# Patient Record
Sex: Male | Born: 2000
Health system: Southern US, Community
[De-identification: ages and names within clinical notes are randomized; demographics above are authoritative.]

## PROBLEM LIST (undated history)

## (undated) DIAGNOSIS — F909 Attention-deficit hyperactivity disorder, unspecified type: Secondary | ICD-10-CM

## (undated) HISTORY — DX: Attention-deficit hyperactivity disorder, unspecified type: F90.9

---
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
## 2019-07-25 ENCOUNTER — Emergency Department (HOSPITAL_COMMUNITY)
Admission: EM | Admit: 2019-07-25 | Discharge: 2019-07-25 | Disposition: A | Discharge status: Home / Self Care | Payer: Medicaid Other | Attending: Emergency Medicine | Admitting: Emergency Medicine

## 2019-07-25 ENCOUNTER — Encounter (HOSPITAL_COMMUNITY): Payer: Self-pay | Admitting: Emergency Medicine

## 2019-07-25 DIAGNOSIS — S50312A Abrasion of left elbow, initial encounter: Secondary | ICD-10-CM | POA: Diagnosis not present

## 2019-07-25 DIAGNOSIS — Y998 Other external cause status: Secondary | ICD-10-CM | POA: Insufficient documentation

## 2019-07-25 DIAGNOSIS — S8011XA Contusion of right lower leg, initial encounter: Secondary | ICD-10-CM | POA: Insufficient documentation

## 2019-07-25 DIAGNOSIS — S90811A Abrasion, right foot, initial encounter: Secondary | ICD-10-CM | POA: Diagnosis not present

## 2019-07-25 DIAGNOSIS — M79661 Pain in right lower leg: Secondary | ICD-10-CM

## 2019-07-25 DIAGNOSIS — F909 Attention-deficit hyperactivity disorder, unspecified type: Secondary | ICD-10-CM | POA: Insufficient documentation

## 2019-07-25 DIAGNOSIS — Y92410 Unspecified street and highway as the place of occurrence of the external cause: Secondary | ICD-10-CM | POA: Insufficient documentation

## 2019-07-25 DIAGNOSIS — T148XXA Other injury of unspecified body region, initial encounter: Secondary | ICD-10-CM

## 2019-07-25 DIAGNOSIS — Y9389 Activity, other specified: Secondary | ICD-10-CM | POA: Diagnosis not present

## 2019-07-25 DIAGNOSIS — R519 Headache, unspecified: Secondary | ICD-10-CM | POA: Insufficient documentation

## 2019-07-25 DIAGNOSIS — S8991XA Unspecified injury of right lower leg, initial encounter: Secondary | ICD-10-CM | POA: Diagnosis present

## 2019-07-25 MED ORDER — IBUPROFEN 800 MG PO TABS
800.0000 mg | ORAL_TABLET | Freq: Once | ORAL | Status: AC
  Administered 2019-07-25: 800 mg via ORAL
  Filled 2019-07-25: qty 1

## 2019-07-25 MED ORDER — NAPROXEN 500 MG PO TABS: 500.0000 mg | ORAL_TABLET | Freq: Two times a day (BID) | ORAL | 0 refills | Status: AC
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
## 2019-07-25 NOTE — Discharge Instructions (Signed)
Take naproxen 2 times a day with meals.  Do not take other anti-inflammatories at the same time (Advil, Motrin, ibuprofen, Aleve). You may supplement with Tylenol if you need further pain control. Use ice packs or heating pads if this helps control your pain. You will likely have continued muscle stiffness and soreness over the next couple days.  Your calf may continue to hurt for up to a couple of weeks. Return to the emergency room if you develop vision changes, vomiting, slurred speech, numbness, loss of bowel or bladder control, or any new or worsening symptoms.

## 2019-07-25 NOTE — ED Notes (Signed)
Patient verbalizes understanding of discharge instructions. Opportunity for questioning and answers were provided. Armband removed by staff, pt discharged from ED ambulatory to home.  

## 2019-08-05 DIAGNOSIS — Z419 Encounter for procedure for purposes other than remedying health state, unspecified: Secondary | ICD-10-CM | POA: Diagnosis not present

## 2019-09-05 DIAGNOSIS — Z419 Encounter for procedure for purposes other than remedying health state, unspecified: Secondary | ICD-10-CM | POA: Diagnosis not present

## 2019-10-06 DIAGNOSIS — Z419 Encounter for procedure for purposes other than remedying health state, unspecified: Secondary | ICD-10-CM | POA: Diagnosis not present

## 2019-11-05 DIAGNOSIS — Z419 Encounter for procedure for purposes other than remedying health state, unspecified: Secondary | ICD-10-CM | POA: Diagnosis not present

## 2019-11-15 ENCOUNTER — Other Ambulatory Visit: Payer: Self-pay

## 2019-12-06 DIAGNOSIS — Z419 Encounter for procedure for purposes other than remedying health state, unspecified: Secondary | ICD-10-CM | POA: Diagnosis not present

## 2020-01-05 DIAGNOSIS — Z419 Encounter for procedure for purposes other than remedying health state, unspecified: Secondary | ICD-10-CM | POA: Diagnosis not present

## 2020-02-05 DIAGNOSIS — Z419 Encounter for procedure for purposes other than remedying health state, unspecified: Secondary | ICD-10-CM | POA: Diagnosis not present

## 2020-03-07 DIAGNOSIS — Z419 Encounter for procedure for purposes other than remedying health state, unspecified: Secondary | ICD-10-CM | POA: Diagnosis not present

## 2020-04-04 DIAGNOSIS — Z419 Encounter for procedure for purposes other than remedying health state, unspecified: Secondary | ICD-10-CM | POA: Diagnosis not present

## 2020-05-05 DIAGNOSIS — Z419 Encounter for procedure for purposes other than remedying health state, unspecified: Secondary | ICD-10-CM | POA: Diagnosis not present

## 2020-06-01 ENCOUNTER — Encounter (HOSPITAL_COMMUNITY): Payer: Self-pay | Admitting: Emergency Medicine

## 2020-06-04 DIAGNOSIS — Z419 Encounter for procedure for purposes other than remedying health state, unspecified: Secondary | ICD-10-CM | POA: Diagnosis not present

## 2020-07-05 DIAGNOSIS — Z419 Encounter for procedure for purposes other than remedying health state, unspecified: Secondary | ICD-10-CM | POA: Diagnosis not present

## 2020-08-04 DIAGNOSIS — Z419 Encounter for procedure for purposes other than remedying health state, unspecified: Secondary | ICD-10-CM | POA: Diagnosis not present

## 2020-09-04 DIAGNOSIS — Z419 Encounter for procedure for purposes other than remedying health state, unspecified: Secondary | ICD-10-CM | POA: Diagnosis not present

## 2020-10-05 DIAGNOSIS — Z419 Encounter for procedure for purposes other than remedying health state, unspecified: Secondary | ICD-10-CM | POA: Diagnosis not present

## 2020-11-04 DIAGNOSIS — Z419 Encounter for procedure for purposes other than remedying health state, unspecified: Secondary | ICD-10-CM | POA: Diagnosis not present

## 2020-12-05 DIAGNOSIS — Z419 Encounter for procedure for purposes other than remedying health state, unspecified: Secondary | ICD-10-CM | POA: Diagnosis not present

## 2021-01-04 DIAGNOSIS — Z419 Encounter for procedure for purposes other than remedying health state, unspecified: Secondary | ICD-10-CM | POA: Diagnosis not present

## 2021-02-04 DIAGNOSIS — Z419 Encounter for procedure for purposes other than remedying health state, unspecified: Secondary | ICD-10-CM | POA: Diagnosis not present

## 2021-03-07 DIAGNOSIS — Z419 Encounter for procedure for purposes other than remedying health state, unspecified: Secondary | ICD-10-CM | POA: Diagnosis not present

## 2021-04-04 DIAGNOSIS — Z419 Encounter for procedure for purposes other than remedying health state, unspecified: Secondary | ICD-10-CM | POA: Diagnosis not present

## 2021-05-05 DIAGNOSIS — Z419 Encounter for procedure for purposes other than remedying health state, unspecified: Secondary | ICD-10-CM | POA: Diagnosis not present

## 2021-06-04 DIAGNOSIS — Z419 Encounter for procedure for purposes other than remedying health state, unspecified: Secondary | ICD-10-CM | POA: Diagnosis not present

## 2021-07-05 DIAGNOSIS — Z419 Encounter for procedure for purposes other than remedying health state, unspecified: Secondary | ICD-10-CM | POA: Diagnosis not present

## 2021-08-04 DIAGNOSIS — Z419 Encounter for procedure for purposes other than remedying health state, unspecified: Secondary | ICD-10-CM | POA: Diagnosis not present

## 2021-09-04 DIAGNOSIS — Z419 Encounter for procedure for purposes other than remedying health state, unspecified: Secondary | ICD-10-CM | POA: Diagnosis not present

## 2021-10-05 DIAGNOSIS — Z419 Encounter for procedure for purposes other than remedying health state, unspecified: Secondary | ICD-10-CM | POA: Diagnosis not present

## 2021-11-04 DIAGNOSIS — Z419 Encounter for procedure for purposes other than remedying health state, unspecified: Secondary | ICD-10-CM | POA: Diagnosis not present

## 2021-12-05 DIAGNOSIS — Z419 Encounter for procedure for purposes other than remedying health state, unspecified: Secondary | ICD-10-CM | POA: Diagnosis not present

## 2022-01-04 DIAGNOSIS — Z419 Encounter for procedure for purposes other than remedying health state, unspecified: Secondary | ICD-10-CM | POA: Diagnosis not present

## 2022-02-04 DIAGNOSIS — Z419 Encounter for procedure for purposes other than remedying health state, unspecified: Secondary | ICD-10-CM | POA: Diagnosis not present

## 2022-03-07 DIAGNOSIS — Z419 Encounter for procedure for purposes other than remedying health state, unspecified: Secondary | ICD-10-CM | POA: Diagnosis not present

## 2022-04-05 DIAGNOSIS — Z419 Encounter for procedure for purposes other than remedying health state, unspecified: Secondary | ICD-10-CM | POA: Diagnosis not present

## 2022-05-06 DIAGNOSIS — Z419 Encounter for procedure for purposes other than remedying health state, unspecified: Secondary | ICD-10-CM | POA: Diagnosis not present

## 2022-06-05 DIAGNOSIS — Z419 Encounter for procedure for purposes other than remedying health state, unspecified: Secondary | ICD-10-CM | POA: Diagnosis not present

## 2022-07-06 DIAGNOSIS — Z419 Encounter for procedure for purposes other than remedying health state, unspecified: Secondary | ICD-10-CM | POA: Diagnosis not present

## 2022-08-05 DIAGNOSIS — Z419 Encounter for procedure for purposes other than remedying health state, unspecified: Secondary | ICD-10-CM | POA: Diagnosis not present

## 2022-09-05 DIAGNOSIS — Z419 Encounter for procedure for purposes other than remedying health state, unspecified: Secondary | ICD-10-CM | POA: Diagnosis not present

## 2022-10-06 DIAGNOSIS — Z419 Encounter for procedure for purposes other than remedying health state, unspecified: Secondary | ICD-10-CM | POA: Diagnosis not present

## 2022-11-05 DIAGNOSIS — Z419 Encounter for procedure for purposes other than remedying health state, unspecified: Secondary | ICD-10-CM | POA: Diagnosis not present

## 2022-12-06 DIAGNOSIS — Z419 Encounter for procedure for purposes other than remedying health state, unspecified: Secondary | ICD-10-CM | POA: Diagnosis not present

## 2023-01-05 DIAGNOSIS — Z419 Encounter for procedure for purposes other than remedying health state, unspecified: Secondary | ICD-10-CM | POA: Diagnosis not present

## 2023-02-05 DIAGNOSIS — Z419 Encounter for procedure for purposes other than remedying health state, unspecified: Secondary | ICD-10-CM | POA: Diagnosis not present

## 2023-03-08 DIAGNOSIS — Z419 Encounter for procedure for purposes other than remedying health state, unspecified: Secondary | ICD-10-CM | POA: Diagnosis not present

## 2023-04-05 DIAGNOSIS — Z419 Encounter for procedure for purposes other than remedying health state, unspecified: Secondary | ICD-10-CM | POA: Diagnosis not present

## 2023-05-17 DIAGNOSIS — Z419 Encounter for procedure for purposes other than remedying health state, unspecified: Secondary | ICD-10-CM | POA: Diagnosis not present

## 2023-06-16 DIAGNOSIS — Z419 Encounter for procedure for purposes other than remedying health state, unspecified: Secondary | ICD-10-CM | POA: Diagnosis not present

## 2023-07-17 DIAGNOSIS — Z419 Encounter for procedure for purposes other than remedying health state, unspecified: Secondary | ICD-10-CM | POA: Diagnosis not present

## 2023-08-16 DIAGNOSIS — Z419 Encounter for procedure for purposes other than remedying health state, unspecified: Secondary | ICD-10-CM | POA: Diagnosis not present

## 2023-09-16 DIAGNOSIS — Z419 Encounter for procedure for purposes other than remedying health state, unspecified: Secondary | ICD-10-CM | POA: Diagnosis not present

## 2023-10-17 DIAGNOSIS — Z419 Encounter for procedure for purposes other than remedying health state, unspecified: Secondary | ICD-10-CM | POA: Diagnosis not present

## 2023-12-17 DIAGNOSIS — Z419 Encounter for procedure for purposes other than remedying health state, unspecified: Secondary | ICD-10-CM | POA: Diagnosis not present
